# Patient Record
Sex: Female | Born: 1965 | Race: White | Hispanic: No | Smoking: Current every day smoker
Health system: Southern US, Community
[De-identification: ages and names within clinical notes are randomized; demographics above are authoritative.]

## PROBLEM LIST (undated history)

## (undated) ENCOUNTER — Emergency Department: Discharge status: Absent without leave | Payer: Self-pay

## (undated) DIAGNOSIS — E7800 Pure hypercholesterolemia, unspecified: Secondary | ICD-10-CM

## (undated) DIAGNOSIS — J449 Chronic obstructive pulmonary disease, unspecified: Secondary | ICD-10-CM

## (undated) DIAGNOSIS — Z951 Presence of aortocoronary bypass graft: Secondary | ICD-10-CM

## (undated) DIAGNOSIS — I10 Essential (primary) hypertension: Secondary | ICD-10-CM

## (undated) HISTORY — PX: CARDIAC SURGERY: SHX584

## (undated) HISTORY — PX: ABDOMINAL SURGERY: SHX537

## (undated) HISTORY — PX: ABDOMINAL HYSTERECTOMY: SHX81

## (undated) HISTORY — PX: CHOLECYSTECTOMY: SHX55

---

## 2003-12-14 ENCOUNTER — Emergency Department: Payer: Self-pay | Admitting: Emergency Medicine

## 2003-12-16 ENCOUNTER — Emergency Department: Payer: Self-pay | Admitting: Internal Medicine

## 2009-07-12 ENCOUNTER — Emergency Department: Payer: Self-pay | Admitting: Emergency Medicine

## 2010-03-22 ENCOUNTER — Emergency Department: Payer: Self-pay | Admitting: Emergency Medicine

## 2010-05-15 ENCOUNTER — Emergency Department: Payer: Self-pay | Admitting: Emergency Medicine

## 2010-07-17 ENCOUNTER — Emergency Department: Payer: Self-pay | Admitting: Emergency Medicine

## 2010-07-21 ENCOUNTER — Emergency Department: Payer: Self-pay | Admitting: Emergency Medicine

## 2010-11-17 ENCOUNTER — Inpatient Hospital Stay: Payer: Self-pay | Admitting: Surgery

## 2011-08-02 IMAGING — CR LEFT GREAT TOE
1 series · 3 of 3 positions shown · non-contrast
Comparison: none

REASON FOR EXAM: fall with pain and bruising
COMMENTS:

[Series 1: view not recorded · 0.17mm/px · 3 of 3 slices shown]
[im 1/3]
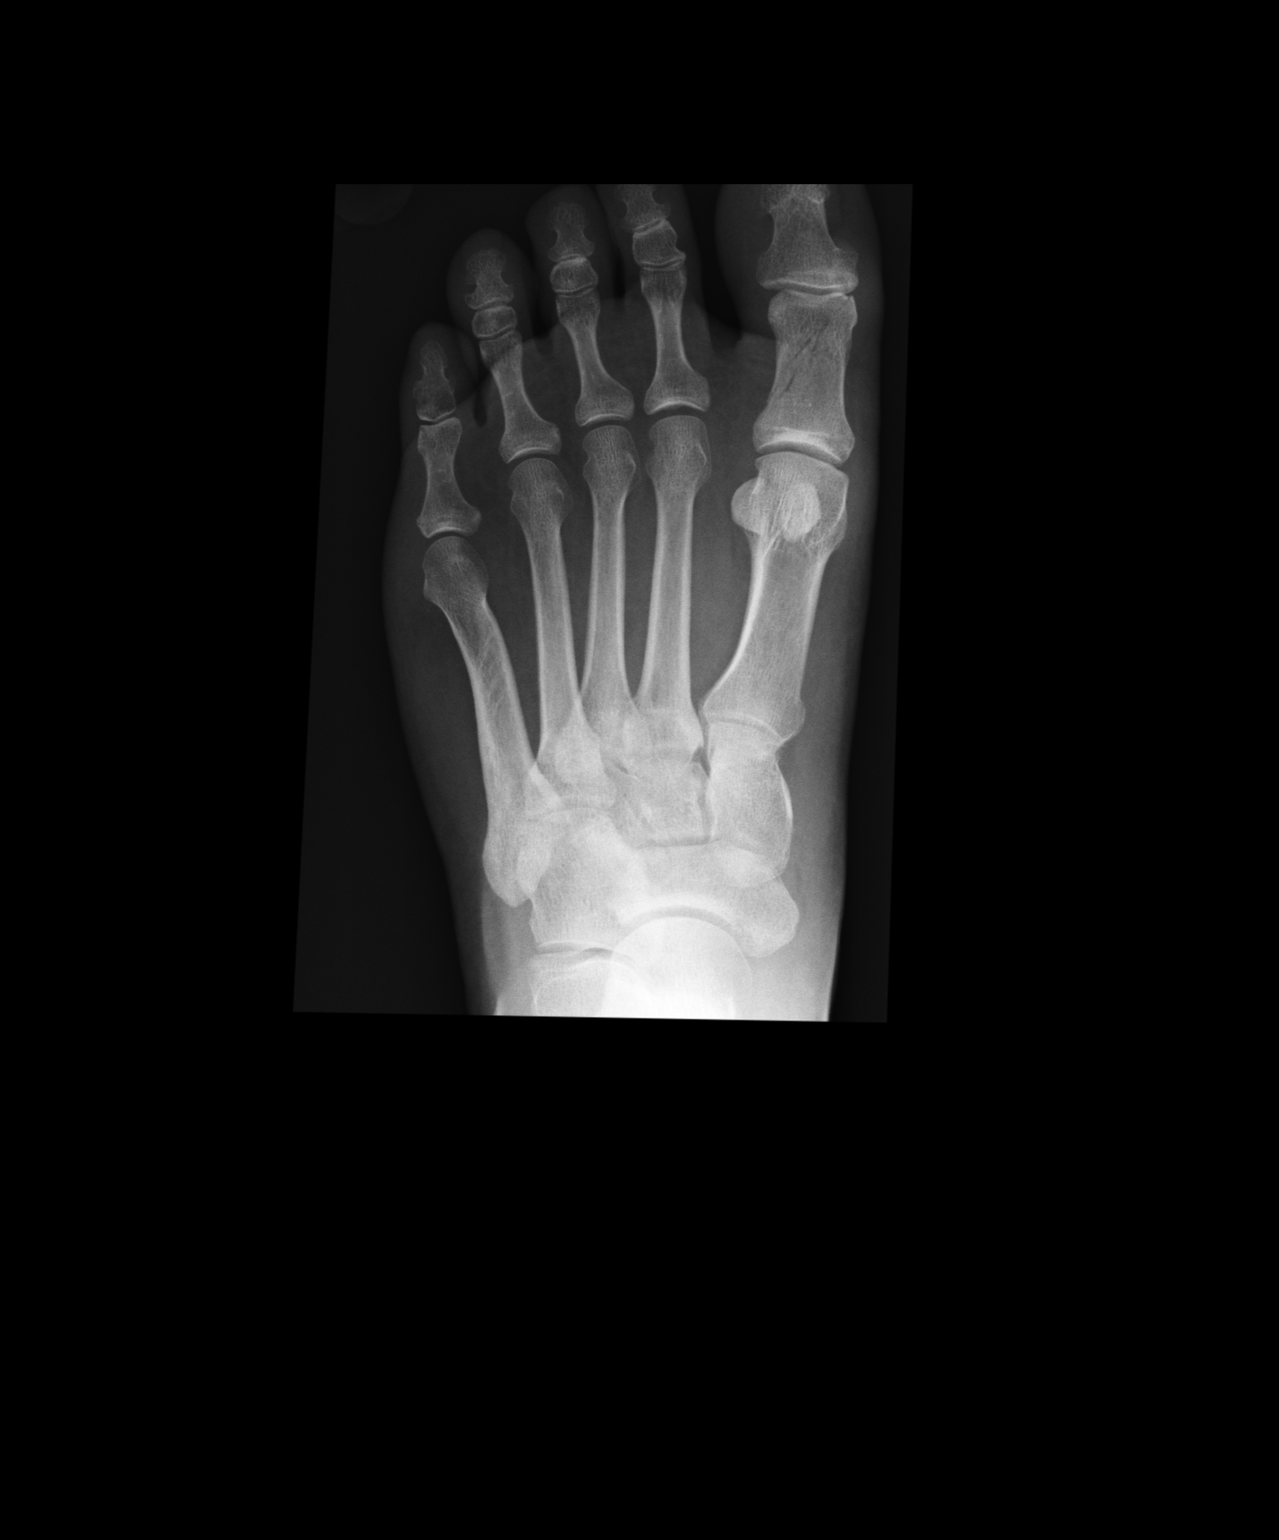
[im 2/3]
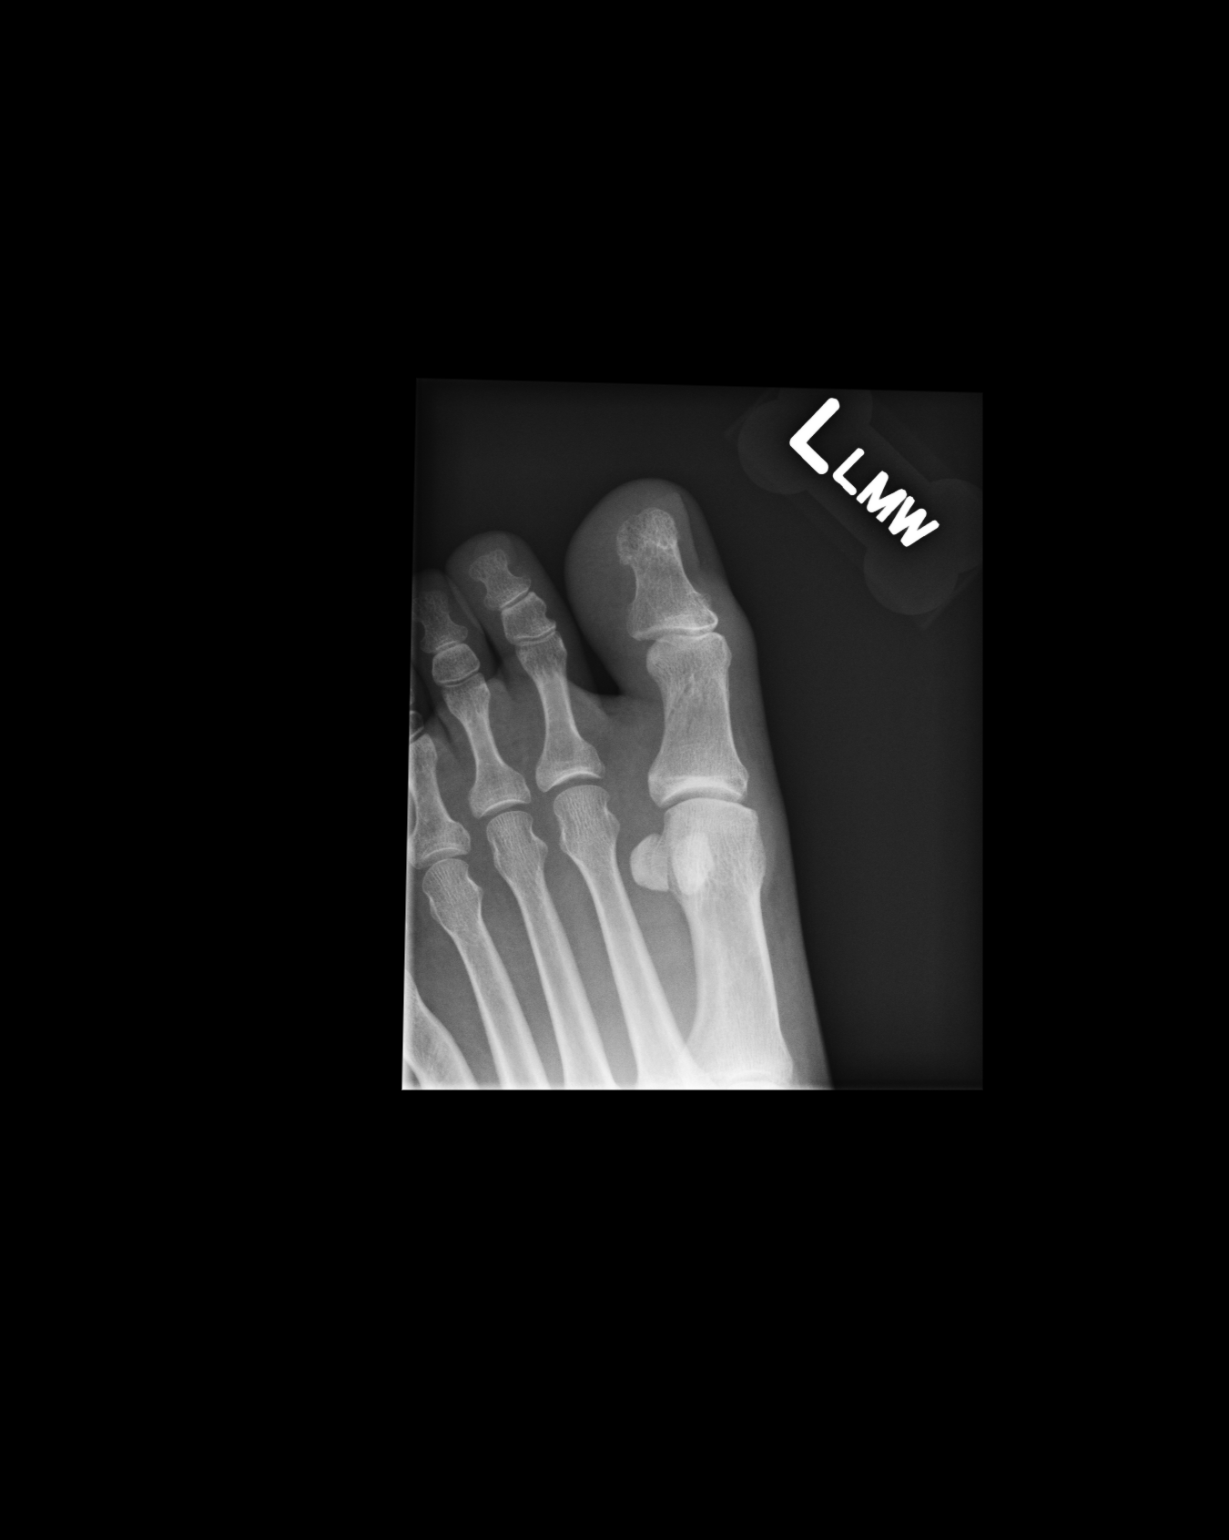
[im 3/3]
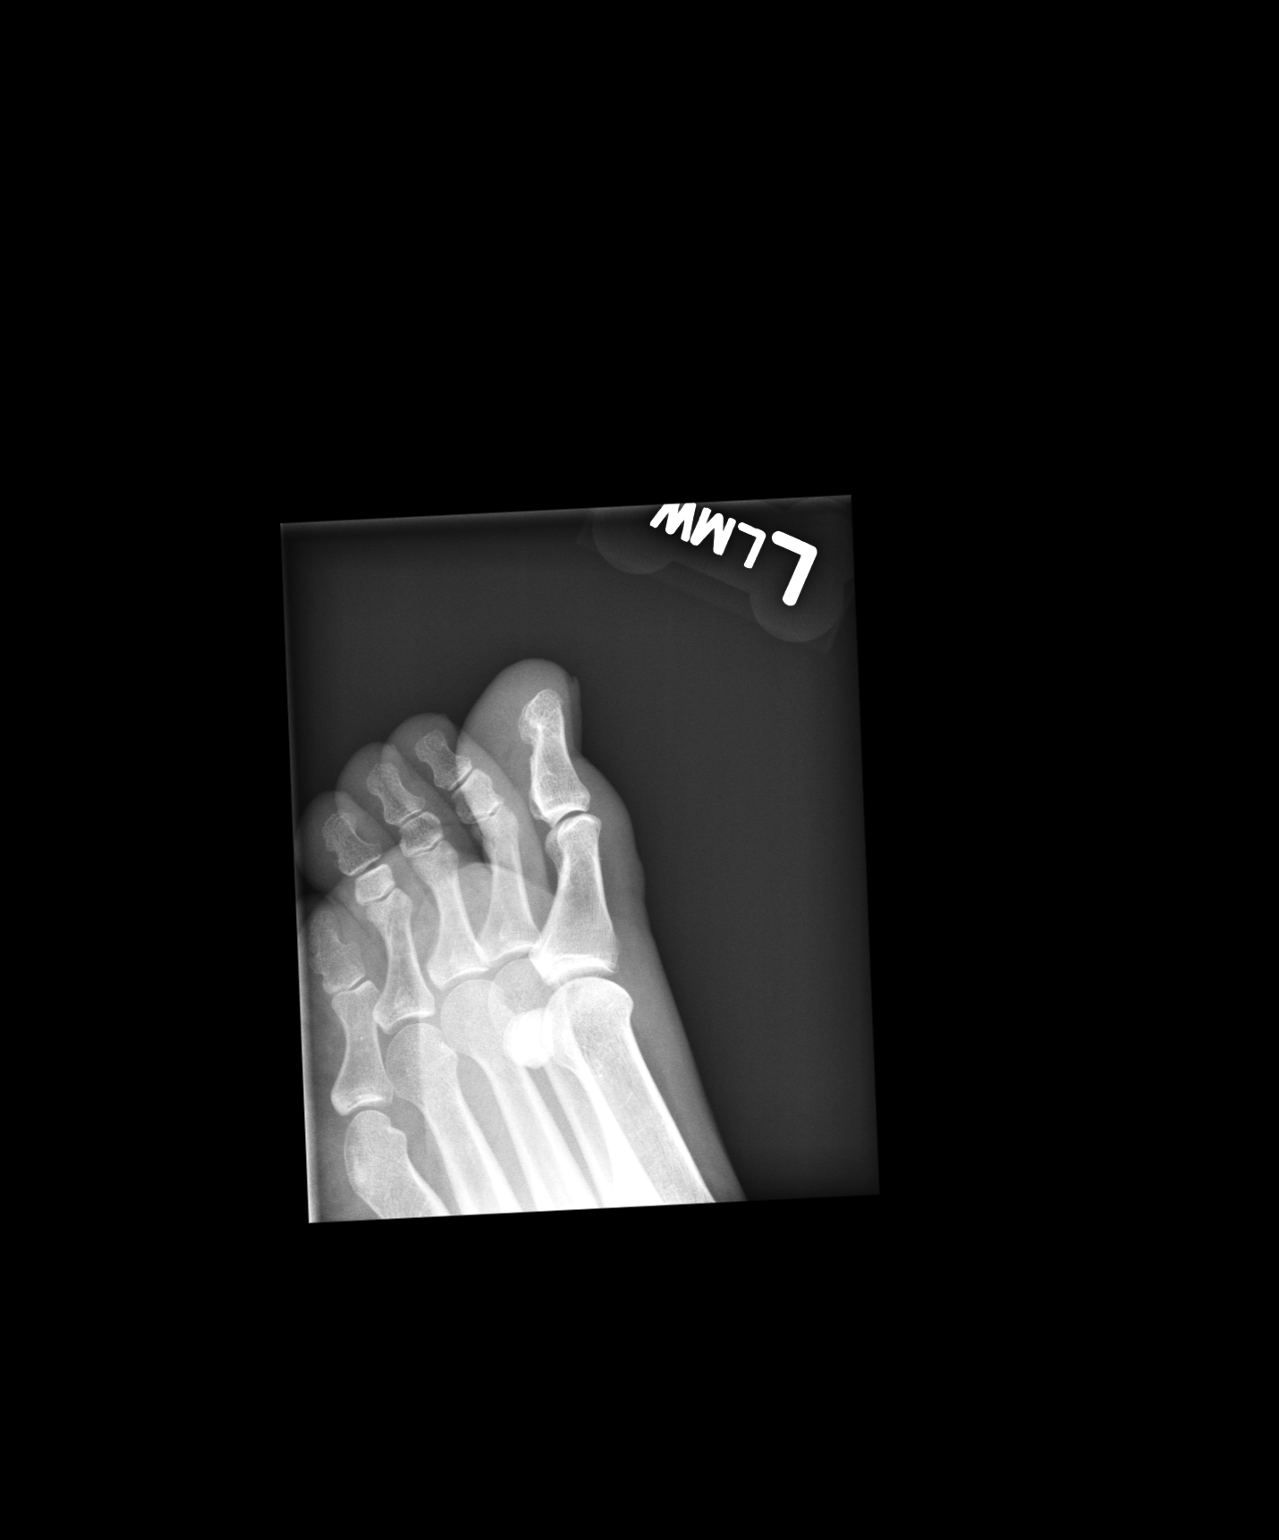

[3 of 3 positions shown; findings below may reference images not displayed]

PROCEDURE:     DXR - DXR TOE GREAT (1ST DIGIT) LT ARICLENIS  - July 12, 2009 [DATE]

RESULT:     Three views of the great toe were obtained. There is an
essentially nondisplaced, oblique, hairline fracture involving the distal
three fourths of the shaft of the proximal phalanx of the great toe. The
fracture line extends into the articular plate medially but no significant
step-off along the articular plate is seen.
IMPRESSION: Fracture of the proximal phalanx of the left, great toe,
minimally displaced.

## 2011-09-15 ENCOUNTER — Emergency Department: Payer: Self-pay | Admitting: Emergency Medicine

## 2011-09-15 LAB — CBC
HCT: 43.6 % (ref 35.0–47.0)
HGB: 15.4 g/dL (ref 12.0–16.0)
MCH: 28.7 pg (ref 26.0–34.0)
MCHC: 35.2 g/dL (ref 32.0–36.0)
MCV: 82 fL (ref 80–100)
Platelet: 208 10*3/uL (ref 150–440)
RBC: 5.35 10*6/uL — ABNORMAL HIGH (ref 3.80–5.20)

## 2011-09-15 LAB — COMPREHENSIVE METABOLIC PANEL
Alkaline Phosphatase: 69 U/L (ref 50–136)
Anion Gap: 11 (ref 7–16)
Bilirubin,Total: 0.4 mg/dL (ref 0.2–1.0)
Calcium, Total: 9 mg/dL (ref 8.5–10.1)
Chloride: 103 mmol/L (ref 98–107)
Co2: 23 mmol/L (ref 21–32)
EGFR (African American): >60
Potassium: 3.3 mmol/L — ABNORMAL LOW (ref 3.5–5.1)
SGOT(AST): 57 U/L — ABNORMAL HIGH (ref 15–37)
Sodium: 137 mmol/L (ref 136–145)

## 2011-09-15 LAB — URINALYSIS, COMPLETE
Bilirubin,UR: NEGATIVE
Ketone: NEGATIVE
Leukocyte Esterase: NEGATIVE
Ph: 5 (ref 4.5–8.0)
Protein: NEGATIVE
RBC,UR: 1 /HPF (ref 0–5)
Specific Gravity: 1.014 (ref 1.003–1.030)
Squamous Epithelial: 4

## 2011-11-05 ENCOUNTER — Emergency Department: Payer: Self-pay | Admitting: Emergency Medicine

## 2012-02-26 ENCOUNTER — Emergency Department: Payer: Self-pay | Admitting: Emergency Medicine

## 2012-02-26 LAB — URINALYSIS, COMPLETE
Bilirubin,UR: NEGATIVE
Glucose,UR: NEGATIVE mg/dL (ref 0–75)
Hyaline Cast: 5
Ketone: NEGATIVE
Leukocyte Esterase: NEGATIVE
Protein: 30
RBC,UR: <1 /HPF (ref 0–5)
Squamous Epithelial: 2

## 2012-10-15 ENCOUNTER — Emergency Department: Payer: Self-pay | Admitting: Emergency Medicine

## 2013-01-01 ENCOUNTER — Emergency Department: Payer: Self-pay | Admitting: Emergency Medicine

## 2013-01-01 LAB — URINALYSIS, COMPLETE
Bacteria: NONE SEEN
Blood: NEGATIVE
Glucose,UR: NEGATIVE mg/dL (ref 0–75)
Ketone: NEGATIVE
Nitrite: NEGATIVE
Squamous Epithelial: 3
WBC UR: 5 /HPF (ref 0–5)

## 2013-01-01 LAB — COMPREHENSIVE METABOLIC PANEL
Albumin: 4.2 g/dL (ref 3.4–5.0)
Alkaline Phosphatase: 83 U/L
Bilirubin,Total: 0.4 mg/dL (ref 0.2–1.0)
Co2: 28 mmol/L (ref 21–32)
Creatinine: 1.04 mg/dL (ref 0.60–1.30)
EGFR (African American): >60
EGFR (Non-African Amer.): >60
Osmolality: 271 (ref 275–301)
SGPT (ALT): 22 U/L (ref 12–78)
Total Protein: 9.4 g/dL — ABNORMAL HIGH (ref 6.4–8.2)

## 2013-01-01 LAB — CBC WITH DIFFERENTIAL/PLATELET
Basophil #: 0.1 10*3/uL (ref 0.0–0.1)
Basophil %: 0.4 %
Eosinophil #: 0.1 10*3/uL (ref 0.0–0.7)
Eosinophil %: 0.4 %
Lymphocyte #: 5.1 10*3/uL — ABNORMAL HIGH (ref 1.0–3.6)
MCH: 28 pg (ref 26.0–34.0)
MCV: 81 fL (ref 80–100)
Monocyte #: 0.6 x10 3/mm (ref 0.2–0.9)
Platelet: 301 10*3/uL (ref 150–440)
RBC: 6.1 10*6/uL — ABNORMAL HIGH (ref 3.80–5.20)
RDW: 15 % — ABNORMAL HIGH (ref 11.5–14.5)

## 2013-01-01 LAB — LIPASE, BLOOD: Lipase: 77 U/L (ref 73–393)

## 2013-10-05 IMAGING — CT CT ABD-PELV W/ CM
1 of 2 series · 16 of 32 positions shown, 20 images · non-contrast
Comparison: none

REASON FOR EXAM: (1) rlq abd pain, hx of appendectomy; (2) rlq abd pain,
hx of appendectomy
COMMENTS:

PROCEDURE:     CT  - CT ABDOMEN / PELVIS  W  - September 15, 2011 [DATE]
RESULT:     History: Pain.
Compare study: Prior CT of 11/16/2010

[Series 2: 3mm soft tissue · axial · 0.87mm/px · z∈[-474,-69]mm · 16 of 149 slices shown, 20 images]
[im 7/149  soft-tissue]
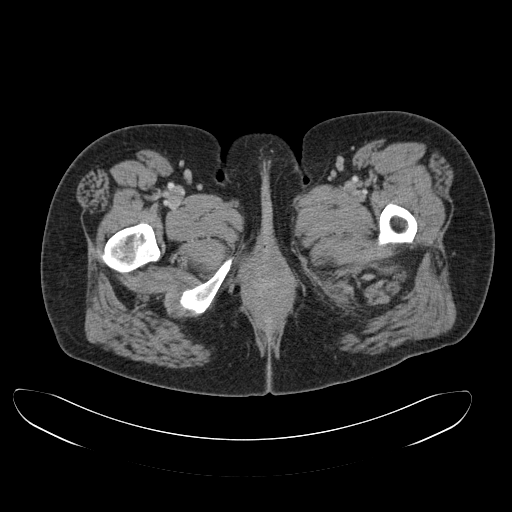
[im 7/149  bone]
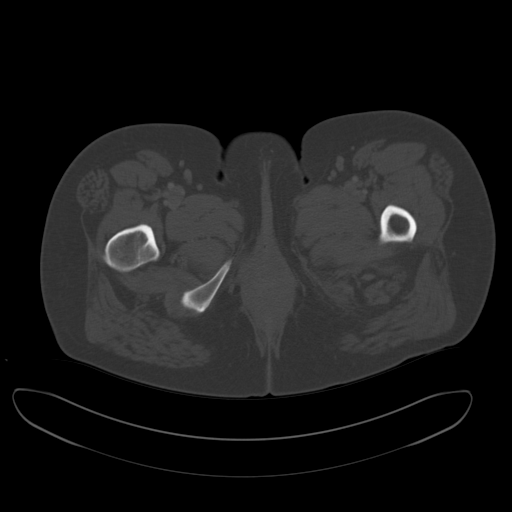
[im 19/149  soft-tissue]
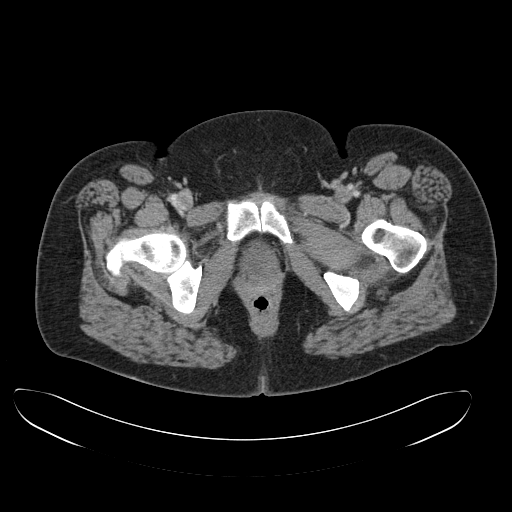
[im 31/149  soft-tissue]
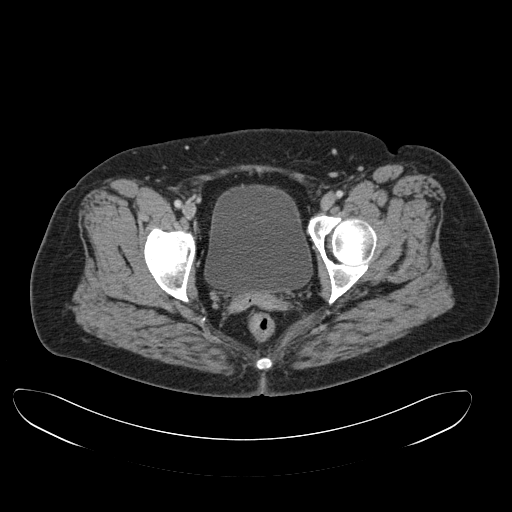
[im 38/149  soft-tissue]
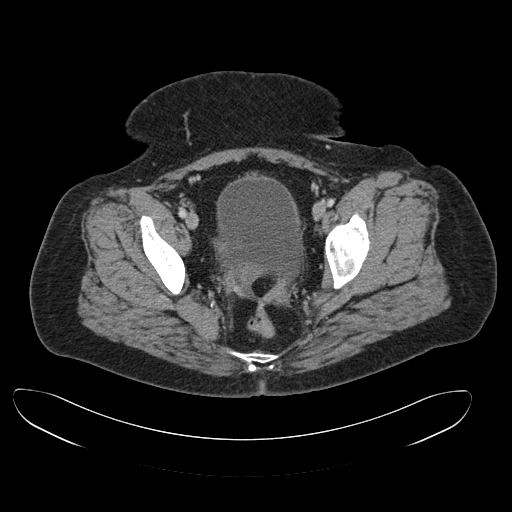
[im 50/149  soft-tissue]
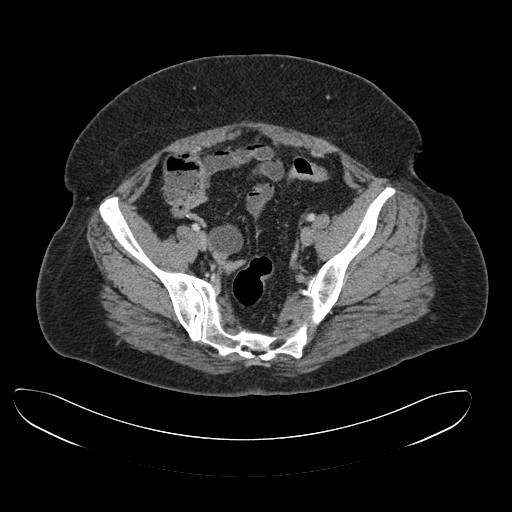
[im 62/149  soft-tissue]
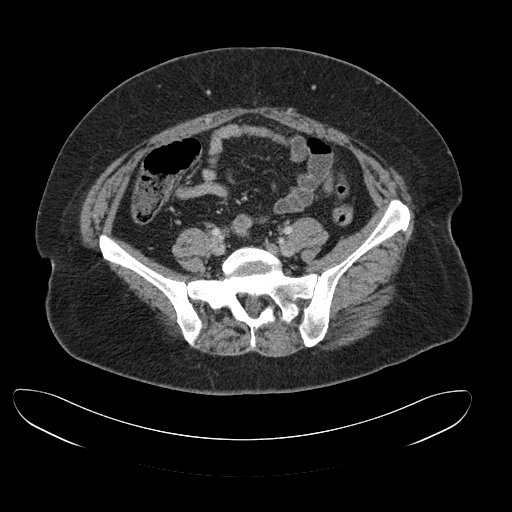
[im 68/149  soft-tissue]
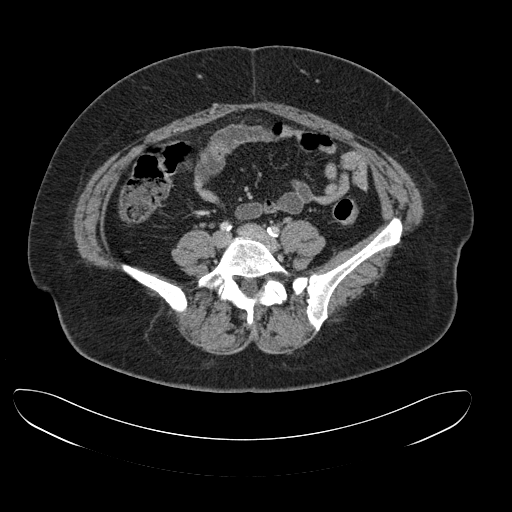
[im 81/149  soft-tissue]
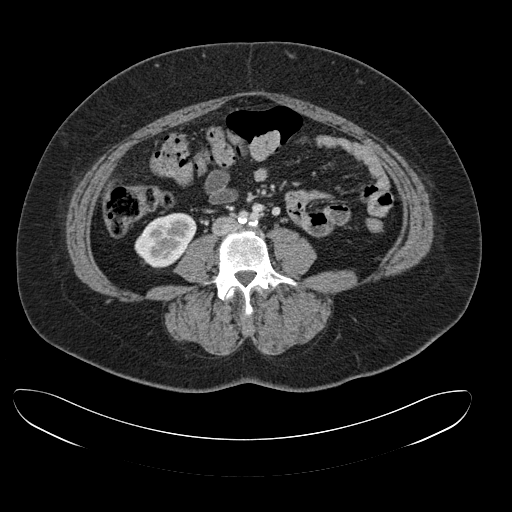
[im 87/149  soft-tissue]
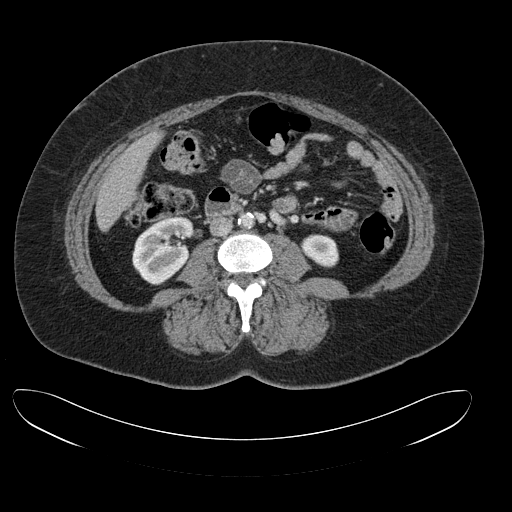
[im 87/149  bone]
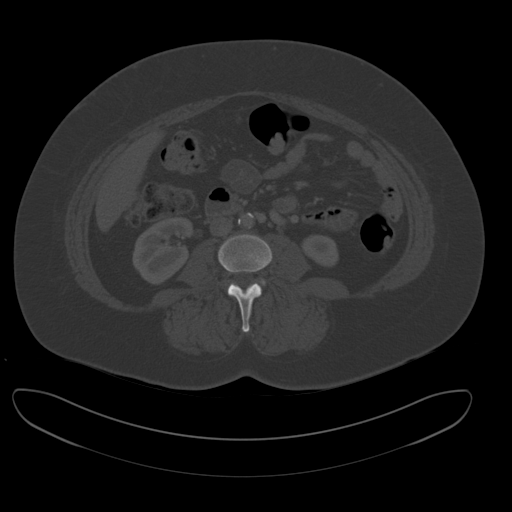
[im 99/149  soft-tissue]
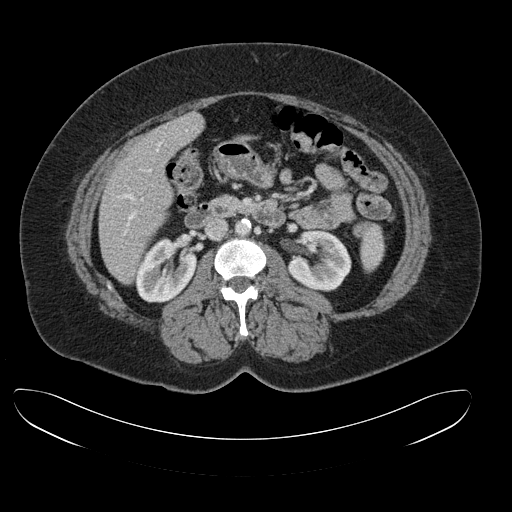
[im 112/149  soft-tissue]
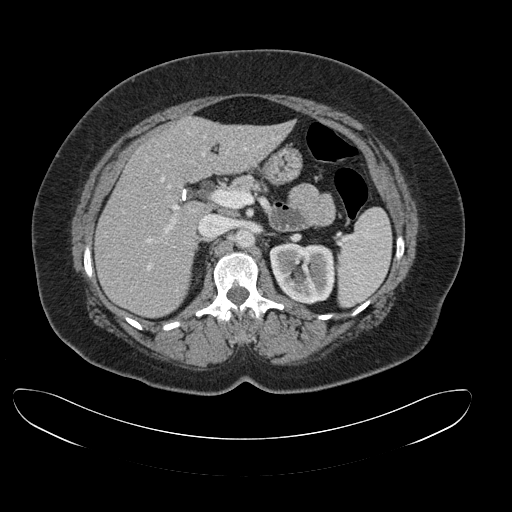
[im 118/149  soft-tissue]
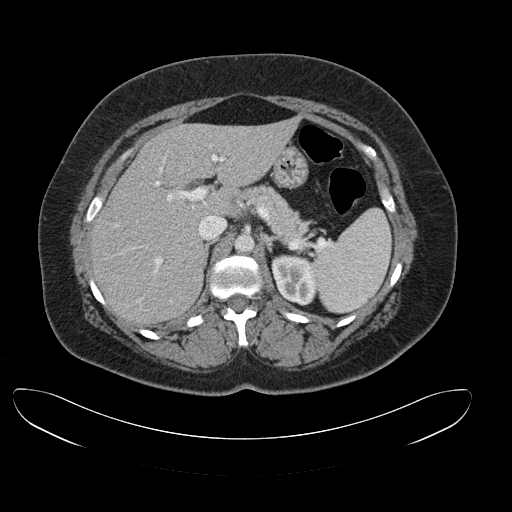
[im 124/149  lung]
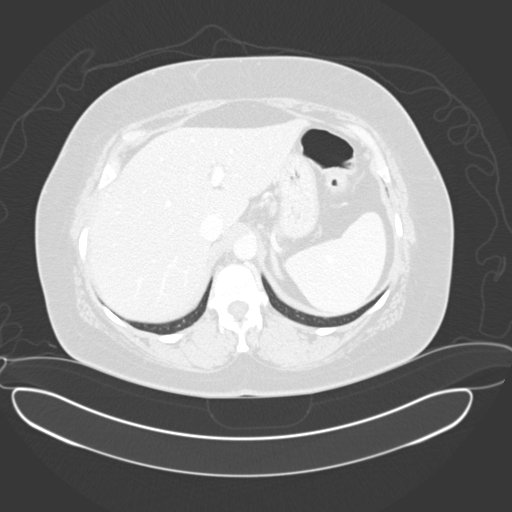
[im 130/149  soft-tissue]
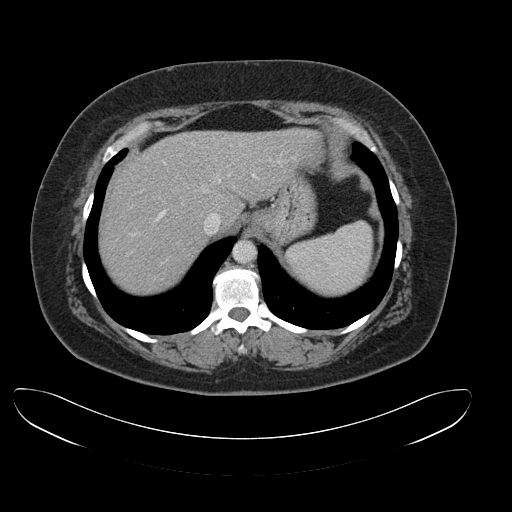
[im 130/149  lung]
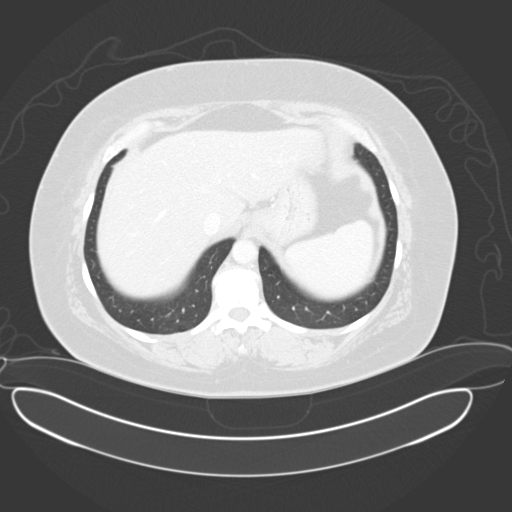
[im 136/149  lung]
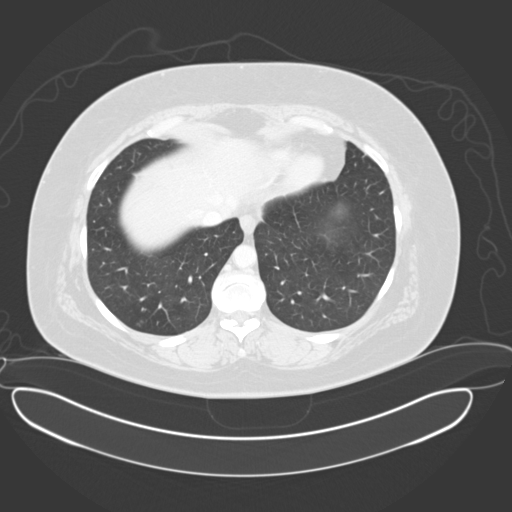
[im 142/149  soft-tissue]
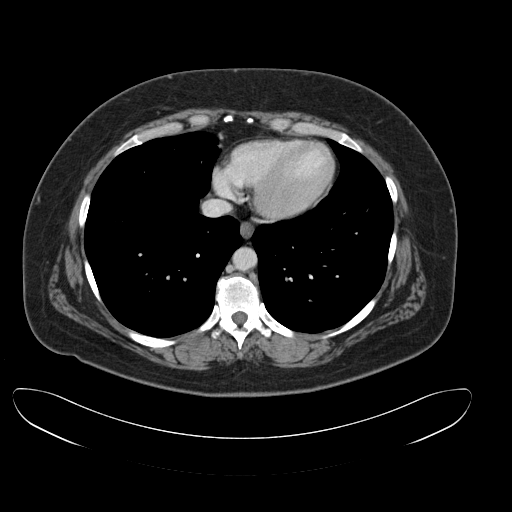
[im 142/149  lung]
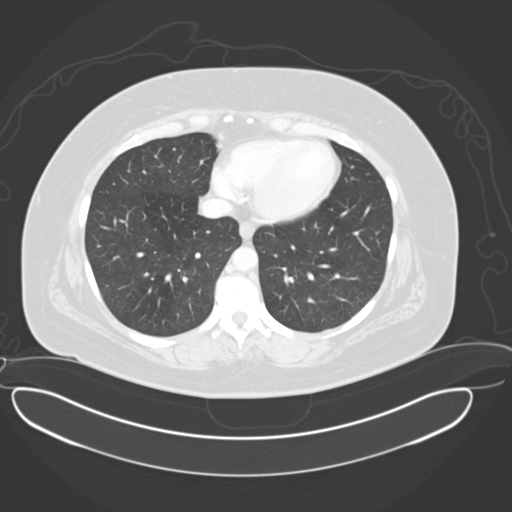

[16 of 32 positions shown; findings below may reference images not displayed]

FINDINGS: Standard CT obtained with 100 cc of Qsovue-SEE. Liver normal.
Prior cholecystectomy. No biliary distention. Spleen normal. Pancreas
normal. Adrenals normal. Kidneys normal. Aorta nondistended. Prior
appendectomy. Bilateral ovarian cyst. The largest is on the right measures
3.3 cm. Pelvic ultrasound can be obtained for further evaluation.
Hysterectomy. Bladder nondistended. No obstructing ureteral stone noted. No
hydronephrosis or focal renal abnormality. Lung bases clear. No free air.
IMPRESSION: Bilateral ovarian cysts. Pelvic ultrasound may prove useful
for further evaluation.

## 2014-10-27 ENCOUNTER — Encounter: Payer: Self-pay | Admitting: Emergency Medicine

## 2014-10-27 ENCOUNTER — Emergency Department
Admission: EM | Admit: 2014-10-27 | Discharge: 2014-10-27 | Disposition: A | Discharge status: Home / Self Care | Payer: Medicaid Other | Attending: Emergency Medicine | Admitting: Emergency Medicine

## 2014-10-27 DIAGNOSIS — R51 Headache: Secondary | ICD-10-CM | POA: Diagnosis not present

## 2014-10-27 DIAGNOSIS — Z792 Long term (current) use of antibiotics: Secondary | ICD-10-CM | POA: Insufficient documentation

## 2014-10-27 DIAGNOSIS — Z760 Encounter for issue of repeat prescription: Secondary | ICD-10-CM | POA: Diagnosis not present

## 2014-10-27 DIAGNOSIS — I10 Essential (primary) hypertension: Secondary | ICD-10-CM | POA: Diagnosis present

## 2014-10-27 DIAGNOSIS — Z720 Tobacco use: Secondary | ICD-10-CM | POA: Insufficient documentation

## 2014-10-27 HISTORY — DX: Presence of aortocoronary bypass graft: Z95.1

## 2014-10-27 HISTORY — DX: Essential (primary) hypertension: I10

## 2014-10-27 HISTORY — DX: Chronic obstructive pulmonary disease, unspecified: J44.9

## 2014-10-27 HISTORY — DX: Pure hypercholesterolemia, unspecified: E78.00

## 2014-10-27 MED ORDER — ALBUTEROL SULFATE HFA 108 (90 BASE) MCG/ACT IN AERS
2.0000 | INHALATION_SPRAY | Freq: Four times a day (QID) | RESPIRATORY_TRACT | Status: AC | PRN
Start: 2014-10-27 — End: ?

## 2014-10-27 MED ORDER — CARVEDILOL 12.5 MG PO TABS: 12.5000 mg | ORAL_TABLET | Freq: Two times a day (BID) | ORAL | Status: AC

## 2014-10-27 MED ORDER — ATORVASTATIN CALCIUM 10 MG PO TABS: 10.0000 mg | ORAL_TABLET | Freq: Every day | ORAL | Status: AC

## 2014-10-27 NOTE — ED Provider Notes (Signed)
[facility] Emergency Department Provider Note  ____________________________________________  Time seen: Approximately 6:15 PM  I have reviewed the triage vital signs and the nursing notes.   HISTORY  Chief Complaint Hypertension   HPI Evelyn Smith is a 49 y.o. female who presents to the emergency department for evaluation of high blood pressure. She states that she has been out of her blood pressure medication for approximately 4-6 weeks. She states that she can always tell when her blood pressure is high because she gets a frontal headache. She states that she has had this headache for the past 2 or 3 days. She states that when she checked her blood pressure today at [facility], it was "very high." She states that she took 2 aspirin which she feels caused her blood pressure to come down, and helped her headache a little bit. She recently moved to the area and does not have a primary care provider. She has a history of CABG 2. She denies chest pain or other symptoms of concern.   Past Medical History  Diagnosis Date  . Hypertension   . S/P CABG x 2   . COPD (chronic obstructive pulmonary disease) (HCC)   . High cholesterol     There are no active problems to display for this patient.   Past Surgical History  Procedure Laterality Date  . Cardiac surgery    . Abdominal surgery    . Cholecystectomy    . Abdominal hysterectomy      Current Outpatient Rx  Name  Route  Sig  Dispense  Refill  . aspirin 325 MG EC tablet   Oral   Take 325 mg by mouth daily.         Marland Kitchen albuterol (PROVENTIL HFA;VENTOLIN HFA) 108 (90 BASE) MCG/ACT inhaler   Inhalation   Inhale 2 puffs into the lungs every 6 (six) hours as needed for wheezing or shortness of breath.   1 Inhaler   2   . atorvastatin (LIPITOR) 10 MG tablet   Oral   Take 1 tablet (10 mg total) by mouth daily.   30 tablet   11   . carvedilol (COREG) 12.5 MG tablet   Oral   Take 1 tablet  (12.5 mg total) by mouth 2 (two) times daily.   60 tablet   11     Allergies Prednisolone  No family history on file.  Social History Social History  Substance Use Topics  . Smoking status: Current Every Day Smoker  . Smokeless tobacco: None  . Alcohol Use: No    Review of Systems Constitutional: No fever/chills Eyes: No visual changes. ENT: No sore throat. Cardiovascular: Denies chest pain. Respiratory: Denies shortness of breath. Gastrointestinal: No abdominal pain.  No nausea, no vomiting.  No diarrhea.  No constipation. Genitourinary: Negative for dysuria. Musculoskeletal: Negative for back pain. Skin: Negative for rash. Neurological: Positive for headaches, negative for weakness or numbness.  10-point ROS otherwise negative.  ____________________________________________   PHYSICAL EXAM:  VITAL SIGNS: ED Triage Vitals  Enc Vitals Group     BP 10/27/14 1755 125/96 mmHg     Pulse Rate 10/27/14 1755 96     Resp 10/27/14 1755 16     Temp 10/27/14 1755 98.6 F (37 C)     Temp Source 10/27/14 1755 Oral     SpO2 10/27/14 1755 95 %     Weight 10/27/14 1755 190 lb (86.183 kg)     Height 10/27/14 1755  (  1.6 m)     Head Cir --      Peak Flow --      Pain Score 10/27/14 1755 9     Pain Loc --      Pain Edu? --      Excl. in GC? --     Constitutional: Alert and oriented. Well appearing and in no acute distress. Eyes: Conjunctivae are normal. PERRL. EOMI. Head: Atraumatic. Nose: No congestion/rhinnorhea. Mouth/Throat: Mucous membranes are moist.  Oropharynx non-erythematous. Neck: No stridor.   Cardiovascular: Normal rate, regular rhythm. Grossly normal heart sounds.  Good peripheral circulation. Respiratory: Normal respiratory effort.  No retractions. Lungs CTAB. Gastrointestinal: Soft and nontender. No distention. No abdominal bruits. No CVA tenderness. Musculoskeletal: No lower extremity tenderness nor edema.  No joint effusions. Neurologic:  Normal  speech and language. No gross focal neurologic deficits are appreciated. No gait instability. Skin:  Skin is warm, dry and intact. No rash noted. Psychiatric: Mood and affect are normal. Speech and behavior are normal.  ____________________________________________   LABS (all labs ordered are listed, but only abnormal results are displayed)  Labs Reviewed - No data to display ____________________________________________  EKG   ____________________________________________  RADIOLOGY   ____________________________________________   PROCEDURES  Procedure(s) performed: None  Critical Care performed: No  ____________________________________________   INITIAL IMPRESSION / ASSESSMENT AND PLAN / ED COURSE  Pertinent labs & imaging results that were available during my care of the patient were reviewed by me and considered in my medical decision making (see chart for details).  Patient was essentially here for medication refill. She was given a one-month supply of her medications and advised that she will have to follow up with a primary care provider for additional medications. She was given information on [facility] as well as [facility] and [place] [facility]. She was also given information regarding medication management clinic. She was advised to return to the emergency department for chest pain or any symptom of concern. ____________________________________________   FINAL CLINICAL IMPRESSION(S) / ED DIAGNOSES  Final diagnoses:  Medication refill      Chinita Pester, FNP 10/27/14 1929  Minna Antis, MD 10/27/14 2318

## 2014-10-27 NOTE — ED Notes (Signed)
Pt reports HTN for past few weeks, today was 248/176. Pt reports hx of HTN, has been off medications for a few weeks. Pt with headache today, reports hx of CABG.

## 2014-10-27 NOTE — ED Notes (Addendum)
Pt took two baby aspirin's today around 1600.  History of CABGx2. C/o headache pain 8/10.

## 2014-10-27 NOTE — Discharge Instructions (Signed)
Medicine Refill at the Emergency Department  We have refilled your medicine today, but it is best for you to get refills through your primary health care provider's office. In the future, please plan ahead so you do not need to get refills from the emergency department.  If the medicine we refilled was a maintenance medicine, you may have received only enough to get you by until you are able to see your regular health care provider.     This information is not intended to replace advice given to you by your health care provider. Make sure you discuss any questions you have with your health care provider.     Document Released: 04/25/2003 Document Revised: 01/27/2014 Document Reviewed: 04/15/2013  Elsevier Interactive Patient Education 2016 [employer]

## 2021-08-28 ENCOUNTER — Emergency Department (HOSPITAL_COMMUNITY)
Admission: EM | Admit: 2021-08-28 | Discharge: 2021-08-29 | Discharge status: Against Medical Advice | Payer: Medicaid Other | Attending: Emergency Medicine | Admitting: Emergency Medicine

## 2021-08-28 ENCOUNTER — Emergency Department (HOSPITAL_COMMUNITY): Payer: Medicaid Other

## 2021-08-28 ENCOUNTER — Other Ambulatory Visit: Payer: Self-pay

## 2021-08-28 DIAGNOSIS — J449 Chronic obstructive pulmonary disease, unspecified: Secondary | ICD-10-CM | POA: Diagnosis not present

## 2021-08-28 DIAGNOSIS — R0602 Shortness of breath: Secondary | ICD-10-CM | POA: Insufficient documentation

## 2021-08-28 DIAGNOSIS — Z5321 Procedure and treatment not carried out due to patient leaving prior to being seen by health care provider: Secondary | ICD-10-CM | POA: Diagnosis not present

## 2021-08-28 MED ORDER — ALBUTEROL SULFATE HFA 108 (90 BASE) MCG/ACT IN AERS: 2.0000 | INHALATION_SPRAY | RESPIRATORY_TRACT | Status: DC | PRN

## 2021-08-28 NOTE — ED Provider Triage Note (Signed)
Emergency Medicine Provider Triage Evaluation Note  Evelyn Smith , a 56 y.o. female  was evaluated in triage.  Pt complains of shortness of breath.  Patient states that symptoms got progressively worse over the past 3 days.  She has known history of COPD with no active treatment at home.  She does not wear oxygen at home.  She denies any associated chest pain.  She states that her shortness of breath is gotten increasingly worse with daily activities.  She denies history of DVT/PE/hormone use/recent surgeries.  She does states she is on disability and is not very active throughout her day..  Review of Systems  Positive: Stable Negative:   Physical Exam  BP (!) 181/95 (BP Location: Left Arm)   Pulse (!) 101   Temp 100.2 F (37.9 C) (Oral)   Resp (!) 25   Ht 5\' 3"  (1.6 m)   SpO2 93%   BMI 33.66 kg/m  Gen:   Awake, no distress   Resp:  Normal effort  MSK:   Moves extremities without difficulty  Other:  Decreased lung sounds in all fields.  No lower extremity edema noted.  No murmurs gallops or rubs appreciated.  Medical Decision Making  Medically screening exam initiated at 11:24 PM.  Appropriate orders placed.  ULLA MCKIERNAN was informed that the remainder of the evaluation will be completed by another provider, this initial triage assessment does not replace that evaluation, and the importance of remaining in the ED until their evaluation is complete.     Vanna Scotland, Peter Garter 08/28/21 2325

## 2021-08-28 NOTE — ED Triage Notes (Addendum)
Patient coming to ED for evaluation of shortness of breath.  Reports "I can't breathe"  Hx of COPD.  Does not currently have inhaler.  Has had cough

## 2021-08-29 LAB — CBC WITH DIFFERENTIAL/PLATELET
Abs Immature Granulocytes: 0.12 10*3/uL — ABNORMAL HIGH (ref 0.00–0.07)
Basophils Absolute: 0 10*3/uL (ref 0.0–0.1)
Basophils Relative: 0 %
Eosinophils Absolute: 0 10*3/uL (ref 0.0–0.5)
Eosinophils Relative: 0 %
HCT: 41.6 % (ref 36.0–46.0)
Hemoglobin: 14.1 g/dL (ref 12.0–15.0)
Immature Granulocytes: 1 %
Lymphocytes Relative: 14 %
Lymphs Abs: 1.3 10*3/uL (ref 0.7–4.0)
MCH: 27.8 pg (ref 26.0–34.0)
MCHC: 33.9 g/dL (ref 30.0–36.0)
MCV: 82.1 fL (ref 80.0–100.0)
Monocytes Absolute: 0.6 10*3/uL (ref 0.1–1.0)
Monocytes Relative: 6 %
Neutro Abs: 7.6 10*3/uL (ref 1.7–7.7)
Neutrophils Relative %: 79 %
Platelets: 193 10*3/uL (ref 150–400)
RBC: 5.07 MIL/uL (ref 3.87–5.11)
RDW: 13.5 % (ref 11.5–15.5)
WBC: 9.6 10*3/uL (ref 4.0–10.5)
nRBC: 0 % (ref 0.0–0.2)

## 2021-08-29 LAB — BASIC METABOLIC PANEL
Anion gap: 13 (ref 5–15)
BUN: 5 mg/dL — ABNORMAL LOW (ref 6–20)
CO2: 24 mmol/L (ref 22–32)
Calcium: 8.1 mg/dL — ABNORMAL LOW (ref 8.9–10.3)
Chloride: 100 mmol/L (ref 98–111)
Creatinine, Ser: 0.82 mg/dL (ref 0.44–1.00)
GFR, Estimated: >60 mL/min (ref >60)
Glucose, Bld: 112 mg/dL — ABNORMAL HIGH (ref 70–99)
Potassium: 2.4 mmol/L — CL (ref 3.5–5.1)
Sodium: 137 mmol/L (ref 135–145)

## 2021-08-29 LAB — BRAIN NATRIURETIC PEPTIDE: B Natriuretic Peptide: 46.2 pg/mL (ref 0.0–100.0)

## 2021-08-29 LAB — TROPONIN I (HIGH SENSITIVITY): Troponin I (High Sensitivity): 12 ng/L (ref <18)

## 2021-08-29 LAB — MAGNESIUM: Magnesium: 1.4 mg/dL — ABNORMAL LOW (ref 1.7–2.4)

## 2021-08-29 MED ORDER — POTASSIUM CHLORIDE CRYS ER 20 MEQ PO TBCR
80.0000 meq | EXTENDED_RELEASE_TABLET | Freq: Once | ORAL | Status: DC
  Filled 2021-08-29: qty 4

## 2021-08-29 NOTE — ED Notes (Signed)
Called lab to add on Magnesium - they advise they will add it on to previously sent blood work.

## 2021-08-29 NOTE — ED Notes (Signed)
Pt called x3 in ED lobby. Currently not present and did not respond to name call at noted times, triage RN notified. Pt could not be located within ED lobby.  °

## 2021-08-29 NOTE — ED Notes (Signed)
Pt called x1 in ED lobby. Pt could not be located within ED lobby, and did not respond to name call.   °

## 2021-08-29 NOTE — ED Notes (Signed)
Pt called x2 in ED lobby. Pt could not be located within ED lobby, and did not respond to name call.   °

## 2023-07-28 ENCOUNTER — Encounter (HOSPITAL_COMMUNITY): Payer: Self-pay | Admitting: *Deleted

## 2023-07-28 ENCOUNTER — Emergency Department (HOSPITAL_COMMUNITY)

## 2023-07-28 ENCOUNTER — Emergency Department (HOSPITAL_COMMUNITY): Admission: EM | Admit: 2023-07-28 | Discharge: 2023-07-28 | Disposition: A | Discharge status: Home / Self Care

## 2023-07-28 ENCOUNTER — Other Ambulatory Visit: Payer: Self-pay

## 2023-07-28 DIAGNOSIS — J441 Chronic obstructive pulmonary disease with (acute) exacerbation: Secondary | ICD-10-CM | POA: Diagnosis not present

## 2023-07-28 DIAGNOSIS — Z7951 Long term (current) use of inhaled steroids: Secondary | ICD-10-CM | POA: Diagnosis not present

## 2023-07-28 DIAGNOSIS — E876 Hypokalemia: Secondary | ICD-10-CM | POA: Insufficient documentation

## 2023-07-28 DIAGNOSIS — Z7982 Long term (current) use of aspirin: Secondary | ICD-10-CM | POA: Diagnosis not present

## 2023-07-28 DIAGNOSIS — R059 Cough, unspecified: Secondary | ICD-10-CM | POA: Diagnosis present

## 2023-07-28 LAB — CBC WITH DIFFERENTIAL/PLATELET
Abs Immature Granulocytes: 0.02 K/uL (ref 0.00–0.07)
Basophils Absolute: 0 K/uL (ref 0.0–0.1)
Basophils Relative: 0 %
Eosinophils Absolute: 0.3 K/uL (ref 0.0–0.5)
Eosinophils Relative: 5 %
HCT: 41.5 % (ref 36.0–46.0)
Hemoglobin: 13.3 g/dL (ref 12.0–15.0)
Immature Granulocytes: 0 %
Lymphocytes Relative: 26 %
Lymphs Abs: 1.4 K/uL (ref 0.7–4.0)
MCH: 26.5 pg (ref 26.0–34.0)
MCHC: 32 g/dL (ref 30.0–36.0)
MCV: 82.7 fL (ref 80.0–100.0)
Monocytes Absolute: 0.5 K/uL (ref 0.1–1.0)
Monocytes Relative: 10 %
Neutro Abs: 3 K/uL (ref 1.7–7.7)
Neutrophils Relative %: 59 %
Platelets: 194 K/uL (ref 150–400)
RBC: 5.02 MIL/uL (ref 3.87–5.11)
RDW: 13.8 % (ref 11.5–15.5)
WBC: 5.3 K/uL (ref 4.0–10.5)
nRBC: 0 % (ref 0.0–0.2)

## 2023-07-28 LAB — TROPONIN I (HIGH SENSITIVITY): Troponin I (High Sensitivity): 6 ng/L (ref <18)

## 2023-07-28 LAB — BASIC METABOLIC PANEL WITH GFR
Anion gap: 10 (ref 5–15)
BUN: 20 mg/dL (ref 6–20)
CO2: 27 mmol/L (ref 22–32)
Calcium: 8.7 mg/dL — ABNORMAL LOW (ref 8.9–10.3)
Chloride: 102 mmol/L (ref 98–111)
Creatinine, Ser: 0.78 mg/dL (ref 0.44–1.00)
GFR, Estimated: >60 mL/min (ref >60)
Glucose, Bld: 95 mg/dL (ref 70–99)
Potassium: 3.1 mmol/L — ABNORMAL LOW (ref 3.5–5.1)
Sodium: 139 mmol/L (ref 135–145)

## 2023-07-28 LAB — BRAIN NATRIURETIC PEPTIDE: B Natriuretic Peptide: 61 pg/mL (ref 0.0–100.0)

## 2023-07-28 LAB — RESP PANEL BY RT-PCR (RSV, FLU A&B, COVID)  RVPGX2
Influenza A by PCR: NEGATIVE
Influenza B by PCR: NEGATIVE
Resp Syncytial Virus by PCR: NEGATIVE
SARS Coronavirus 2 by RT PCR: NEGATIVE

## 2023-07-28 MED ORDER — ALBUTEROL SULFATE HFA 108 (90 BASE) MCG/ACT IN AERS: 1.0000 | INHALATION_SPRAY | RESPIRATORY_TRACT | 0 refills | Status: AC | PRN

## 2023-07-28 MED ORDER — ALBUTEROL SULFATE (2.5 MG/3ML) 0.083% IN NEBU
2.5000 mg | INHALATION_SOLUTION | Freq: Once | RESPIRATORY_TRACT | Status: AC
  Administered 2023-07-28: 2.5 mg via RESPIRATORY_TRACT
  Filled 2023-07-28: qty 3

## 2023-07-28 MED ORDER — IPRATROPIUM-ALBUTEROL 0.5-2.5 (3) MG/3ML IN SOLN
3.0000 mL | Freq: Once | RESPIRATORY_TRACT | Status: AC
  Administered 2023-07-28: 3 mL via RESPIRATORY_TRACT
  Filled 2023-07-28: qty 3

## 2023-07-28 MED ORDER — ALBUTEROL SULFATE (2.5 MG/3ML) 0.083% IN NEBU: 2.5000 mg | INHALATION_SOLUTION | Freq: Four times a day (QID) | RESPIRATORY_TRACT | 12 refills | Status: AC | PRN

## 2023-07-28 NOTE — Discharge Instructions (Signed)
 Take your inhalers and nebulizers as needed.  Follow-up with your primary care doctor.  Return to the ER for any new or worsening symptoms.

## 2023-07-28 NOTE — ED Triage Notes (Signed)
 Here by POV from home for sob, ongoing over last 1-2 weeks. H/o COPD. Seen last week in [facility] ED and prescribed diuretic and bronchodilator. No lasting relief. Sx continue gradually progressively worse. No obvious edema. LS decreased. COPD protocol initiated. RT notified. Alert, NAD, calm, interactive, increased wob.

## 2023-07-28 NOTE — ED Provider Notes (Signed)
 [place] EMERGENCY DEPARTMENT AT [facility] Provider Note   CSN: [id number] Arrival date & time: 07/28/23  9240     Patient presents with: Shortness of Breath   Evelyn Smith is a 58 y.o. female.   58 year old female presents for evaluation of shortness of breath.  States it gets worse when she walks.  States it feels like her normal COPD exacerbation and she has run out of nebulizers.  States she has been using her inhaler as needed.  Admits to increased cough as well.  Denies any other symptoms or concerns at this time.  States her chest just feels tight.   Shortness of Breath Associated symptoms: cough   Associated symptoms: no abdominal pain, no chest pain, no ear pain, no fever, no rash, no sore throat and no vomiting        Prior to Admission medications   Medication Sig Start Date End Date Taking? Authorizing Provider  albuterol  (PROVENTIL ) (2.5 MG/3ML) 0.083% nebulizer solution Take 3 mLs (2.5 mg total) by nebulization every 6 (six) hours as needed for wheezing or shortness of breath. 07/28/23  Yes Jerre Diguglielmo L, DO  albuterol  (VENTOLIN  HFA) 108 (90 Base) MCG/ACT inhaler Inhale 1-2 puffs into the lungs every 4 (four) hours as needed for wheezing or shortness of breath. 07/28/23  Yes Shanie Mauzy L, DO  albuterol  (PROVENTIL  HFA;VENTOLIN  HFA) 108 (90 BASE) MCG/ACT inhaler Inhale 2 puffs into the lungs every 6 (six) hours as needed for wheezing or shortness of breath. 10/27/14   Triplett, Kirk B, FNP  aspirin 325 MG EC tablet Take 325 mg by mouth daily.    [provider]  atorvastatin  (LIPITOR) 10 MG tablet Take 1 tablet (10 mg total) by mouth daily. 10/27/14 10/27/15  Triplett, Kirk B, FNP  carvedilol  (COREG ) 12.5 MG tablet Take 1 tablet (12.5 mg total) by mouth 2 (two) times daily. 10/27/14 10/27/15  Triplett, Kirk B, FNP    Allergies: Prednisolone    Review of Systems  Constitutional:  Negative for chills and fever.  HENT:  Negative for ear pain  and sore throat.   Eyes:  Negative for pain and visual disturbance.  Respiratory:  Positive for cough and shortness of breath.   Cardiovascular:  Negative for chest pain and palpitations.  Gastrointestinal:  Negative for abdominal pain and vomiting.  Genitourinary:  Negative for dysuria and hematuria.  Musculoskeletal:  Negative for arthralgias and back pain.  Skin:  Negative for color change and rash.  Neurological:  Negative for seizures and syncope.  All other systems reviewed and are negative.   Updated Vital Signs BP (!) 153/86 (BP Location: Left Arm)   Pulse 95   Temp 98.1 F (36.7 C) (Oral)   Resp 18   Wt 86.2 kg   SpO2 96%   BMI 33.66 kg/m   Physical Exam Vitals and nursing note reviewed.  Constitutional:      General: She is not in acute distress.    Appearance: She is well-developed. She is not ill-appearing.  HENT:     Head: Normocephalic and atraumatic.  Eyes:     Conjunctiva/sclera: Conjunctivae normal.  Cardiovascular:     Rate and Rhythm: Normal rate and regular rhythm.     Heart sounds: No murmur heard. Pulmonary:     Effort: Pulmonary effort is normal. No respiratory distress.     Breath sounds: Examination of the right-upper field reveals decreased breath sounds and wheezing. Examination of the left-upper field reveals decreased breath sounds  and wheezing. Examination of the right-middle field reveals decreased breath sounds and wheezing. Examination of the left-middle field reveals decreased breath sounds and wheezing. Examination of the right-lower field reveals decreased breath sounds and wheezing. Examination of the left-lower field reveals decreased breath sounds and wheezing. Decreased breath sounds and wheezing present. No rhonchi or rales.  Chest:     Chest wall: No mass, deformity, tenderness or crepitus.  Abdominal:     Palpations: Abdomen is soft.     Tenderness: There is no abdominal tenderness.  Musculoskeletal:        General: No swelling.      Cervical back: Neck supple.  Skin:    General: Skin is warm and dry.     Capillary Refill: Capillary refill takes less than 2 seconds.  Neurological:     Mental Status: She is alert.  Psychiatric:        Mood and Affect: Mood normal.     (all labs ordered are listed, but only abnormal results are displayed) Labs Reviewed  BASIC METABOLIC PANEL WITH GFR - Abnormal; Notable for the following components:      Result Value   Potassium 3.1 (*)    Calcium  8.7 (*)    All other components within normal limits  RESP PANEL BY RT-PCR (RSV, FLU A&B, COVID)  [id number]  CBC WITH DIFFERENTIAL/PLATELET  BRAIN NATRIURETIC PEPTIDE  TROPONIN I (HIGH SENSITIVITY)  TROPONIN I (HIGH SENSITIVITY)    EKG: EKG Interpretation Date/Time:  Tuesday July 28 2023 08:09:55 EDT Ventricular Rate:  71 PR Interval:  139 QRS Duration:  102 QT Interval:  377 QTC Calculation: 410 R Axis:   79  Text Interpretation: Sinus rhythm Probable LVH St segment depressions/T wave inversions in inferior and lateral leads which are present but more evident now Compared with previous EKG from 08/28/2021 Confirmed by Gennaro Bouchard ([id number]) on 07/28/2023 8:45:29 AM  Radiology: [facility] Chest Port 1 View Result Date: 07/28/2023 CLINICAL DATA:  sob, cough, recent nebs and Lasix EXAM: PORTABLE CHEST - 1 VIEW COMPARISON:  August 28, 2021 FINDINGS: Sternotomy wires. Bilateral perihilar interstitial opacities with peribronchial cuffing. No focal airspace consolidation or pleural effusion. No pneumothorax. No cardiomegaly. No acute fracture or destructive lesion. IMPRESSION: Lung changes suggestive of either acute bronchitis or asthma. No lobar pneumonia or pleural effusion. Electronically Signed   By: Rogelia Myers M.D.   On: 07/28/2023 08:44     Procedures   Medications Ordered in the ED  ipratropium-albuterol  (DUONEB) 0.5-2.5 (3) MG/3ML nebulizer solution 3 mL (3 mLs Nebulization Given 07/28/23 0851)  albuterol  (PROVENTIL ) (2.5 MG/3ML)  0.083% nebulizer solution 2.5 mg (2.5 mg Nebulization Given 07/28/23 0851)                                    Medical Decision Making Medical Decision Making Nursing notes are reviewed. Differential diagnosis for this patient would include but not limited to: COPD exacerbation, pneumonia, COVID, ACS, other  Cardiac monitor interpretation: Sinus rhythm, no ectopy  Emergency Department Course:  Vital signs and pulse oximetry are reviewed, evaluated by myself and found to be within normal limits prior to final disposition. Findings of laboratory testing and medical imaging are discussed with patient and family that is available. Patient agrees with the medical care plan as follows:  Patient's lab workup reviewed remained unremarkable her troponin is negative x 2.  She is much better after 2 breathing treatments.  Declines  steroids as she states she is allergic.  Will refill her inhaler as well as her nebulizers.  She was found to be hypokalemic as well.  Advised to eat a banana daily and follow-up with primary care.  Advised return to the ER for any worsening symptoms.  She feels comfortable being discharged home.  Problems Addressed: COPD exacerbation (HCC): acute illness or injury that poses a threat to life or bodily functions Hypokalemia: acute illness or injury  Amount and/or Complexity of Data Reviewed External Data Reviewed: notes.    Details: Previous ER visit-patient seen recently for COPD exacerbation but she lives in [place]  Labs: ordered. Decision-making details documented in ED Course.    Details: Labs ordered and reviewed by me and hopefully unremarkable except for some mild hypokalemia.  Troponins negative, CBC BMP and BNP within normal limits Radiology: ordered and independent interpretation performed. Decision-making details documented in ED Course.    Details: Chest x-ray was ordered and interpreted independently of radiology and shows no acute abnormality in  chest, no infiltrate ECG/medicine tests: ordered and independent interpretation performed. Decision-making details documented in ED Course.    Details: Ordered and interpreted in the absence of cardiology shows sinus rhythm, no STEMI and no acute change when compared to prior  Risk OTC drugs. Prescription drug management.    Final diagnoses:  COPD exacerbation (HCC)  Hypokalemia    ED Discharge Orders          Ordered    albuterol  (VENTOLIN  HFA) 108 (90 Base) MCG/ACT inhaler  Every 4 hours PRN        07/28/23 1014    albuterol  (PROVENTIL ) (2.5 MG/3ML) 0.083% nebulizer solution  Every 6 hours PRN        07/28/23 1014               Chrystopher Stangl L, DO 07/28/23 1426
